# Patient Record
Sex: Female | Born: 1950 | Race: White | Hispanic: No | Marital: Married
Health system: Southern US, Community
[De-identification: ages and names within clinical notes are randomized; demographics above are authoritative.]

---

## 1999-08-13 ENCOUNTER — Encounter: Admission: RE | Admit: 1999-08-13 | Discharge: 1999-08-13 | Payer: Self-pay | Admitting: Family Medicine

## 1999-08-13 ENCOUNTER — Encounter: Payer: Self-pay | Admitting: Family Medicine

## 1999-12-04 ENCOUNTER — Encounter (INDEPENDENT_AMBULATORY_CARE_PROVIDER_SITE_OTHER): Payer: Self-pay | Admitting: Specialist

## 1999-12-05 ENCOUNTER — Observation Stay (HOSPITAL_COMMUNITY): Admission: EM | Admit: 1999-12-05 | Discharge: 1999-12-05 | Payer: Self-pay | Admitting: Emergency Medicine

## 1999-12-05 ENCOUNTER — Encounter: Payer: Self-pay | Admitting: Family Medicine

## 2000-09-18 ENCOUNTER — Other Ambulatory Visit: Admission: RE | Admit: 2000-09-18 | Discharge: 2000-09-18 | Payer: Self-pay | Admitting: Obstetrics and Gynecology

## 2000-09-18 ENCOUNTER — Encounter: Admission: RE | Admit: 2000-09-18 | Discharge: 2000-09-18 | Payer: Self-pay | Admitting: Family Medicine

## 2000-09-18 ENCOUNTER — Encounter: Payer: Self-pay | Admitting: Family Medicine

## 2001-11-21 ENCOUNTER — Encounter: Payer: Self-pay | Admitting: Family Medicine

## 2001-11-21 ENCOUNTER — Encounter: Admission: RE | Admit: 2001-11-21 | Discharge: 2001-11-21 | Payer: Self-pay | Admitting: Family Medicine

## 2002-12-23 ENCOUNTER — Encounter: Payer: Self-pay | Admitting: Family Medicine

## 2002-12-23 ENCOUNTER — Encounter: Admission: RE | Admit: 2002-12-23 | Discharge: 2002-12-23 | Payer: Self-pay | Admitting: Family Medicine

## 2004-01-22 ENCOUNTER — Other Ambulatory Visit: Admission: RE | Admit: 2004-01-22 | Discharge: 2004-01-22 | Payer: Self-pay | Admitting: Family Medicine

## 2004-02-02 ENCOUNTER — Encounter: Admission: RE | Admit: 2004-02-02 | Discharge: 2004-02-02 | Payer: Self-pay | Admitting: Family Medicine

## 2005-02-25 ENCOUNTER — Encounter: Admission: RE | Admit: 2005-02-25 | Discharge: 2005-02-25 | Payer: Self-pay | Admitting: Family Medicine

## 2006-02-27 ENCOUNTER — Encounter: Admission: RE | Admit: 2006-02-27 | Discharge: 2006-02-27 | Payer: Self-pay | Admitting: Family Medicine

## 2007-03-01 ENCOUNTER — Encounter: Admission: RE | Admit: 2007-03-01 | Discharge: 2007-03-01 | Payer: Self-pay | Admitting: Family Medicine

## 2008-03-11 ENCOUNTER — Encounter: Admission: RE | Admit: 2008-03-11 | Discharge: 2008-03-11 | Payer: Self-pay | Admitting: Unknown Physician Specialty

## 2008-03-17 ENCOUNTER — Encounter: Admission: RE | Admit: 2008-03-17 | Discharge: 2008-03-17 | Payer: Self-pay | Admitting: Unknown Physician Specialty

## 2008-03-17 ENCOUNTER — Encounter (INDEPENDENT_AMBULATORY_CARE_PROVIDER_SITE_OTHER): Payer: Self-pay | Admitting: Diagnostic Radiology

## 2008-03-20 ENCOUNTER — Encounter: Admission: RE | Admit: 2008-03-20 | Discharge: 2008-03-20 | Payer: Self-pay | Admitting: Unknown Physician Specialty

## 2008-04-11 ENCOUNTER — Ambulatory Visit (HOSPITAL_COMMUNITY): Admission: RE | Admit: 2008-04-11 | Discharge: 2008-04-12 | Payer: Self-pay | Admitting: Surgery

## 2008-04-11 ENCOUNTER — Encounter (INDEPENDENT_AMBULATORY_CARE_PROVIDER_SITE_OTHER): Payer: Self-pay | Admitting: Surgery

## 2008-05-07 ENCOUNTER — Ambulatory Visit (HOSPITAL_BASED_OUTPATIENT_CLINIC_OR_DEPARTMENT_OTHER): Admission: RE | Admit: 2008-05-07 | Discharge: 2008-05-07 | Payer: Self-pay | Admitting: Surgery

## 2008-10-01 ENCOUNTER — Ambulatory Visit (HOSPITAL_BASED_OUTPATIENT_CLINIC_OR_DEPARTMENT_OTHER): Admission: RE | Admit: 2008-10-01 | Discharge: 2008-10-01 | Payer: Self-pay | Admitting: Surgery

## 2009-06-04 IMAGING — CR DG CHEST 1V PORT
1 series · 1 of 1 positions shown · non-contrast
Comparison: .  No priors

CLINICAL DATA: Port-A-Cath placement for breast cancer

PORTABLE CHEST - 1 VIEW

[view not recorded]
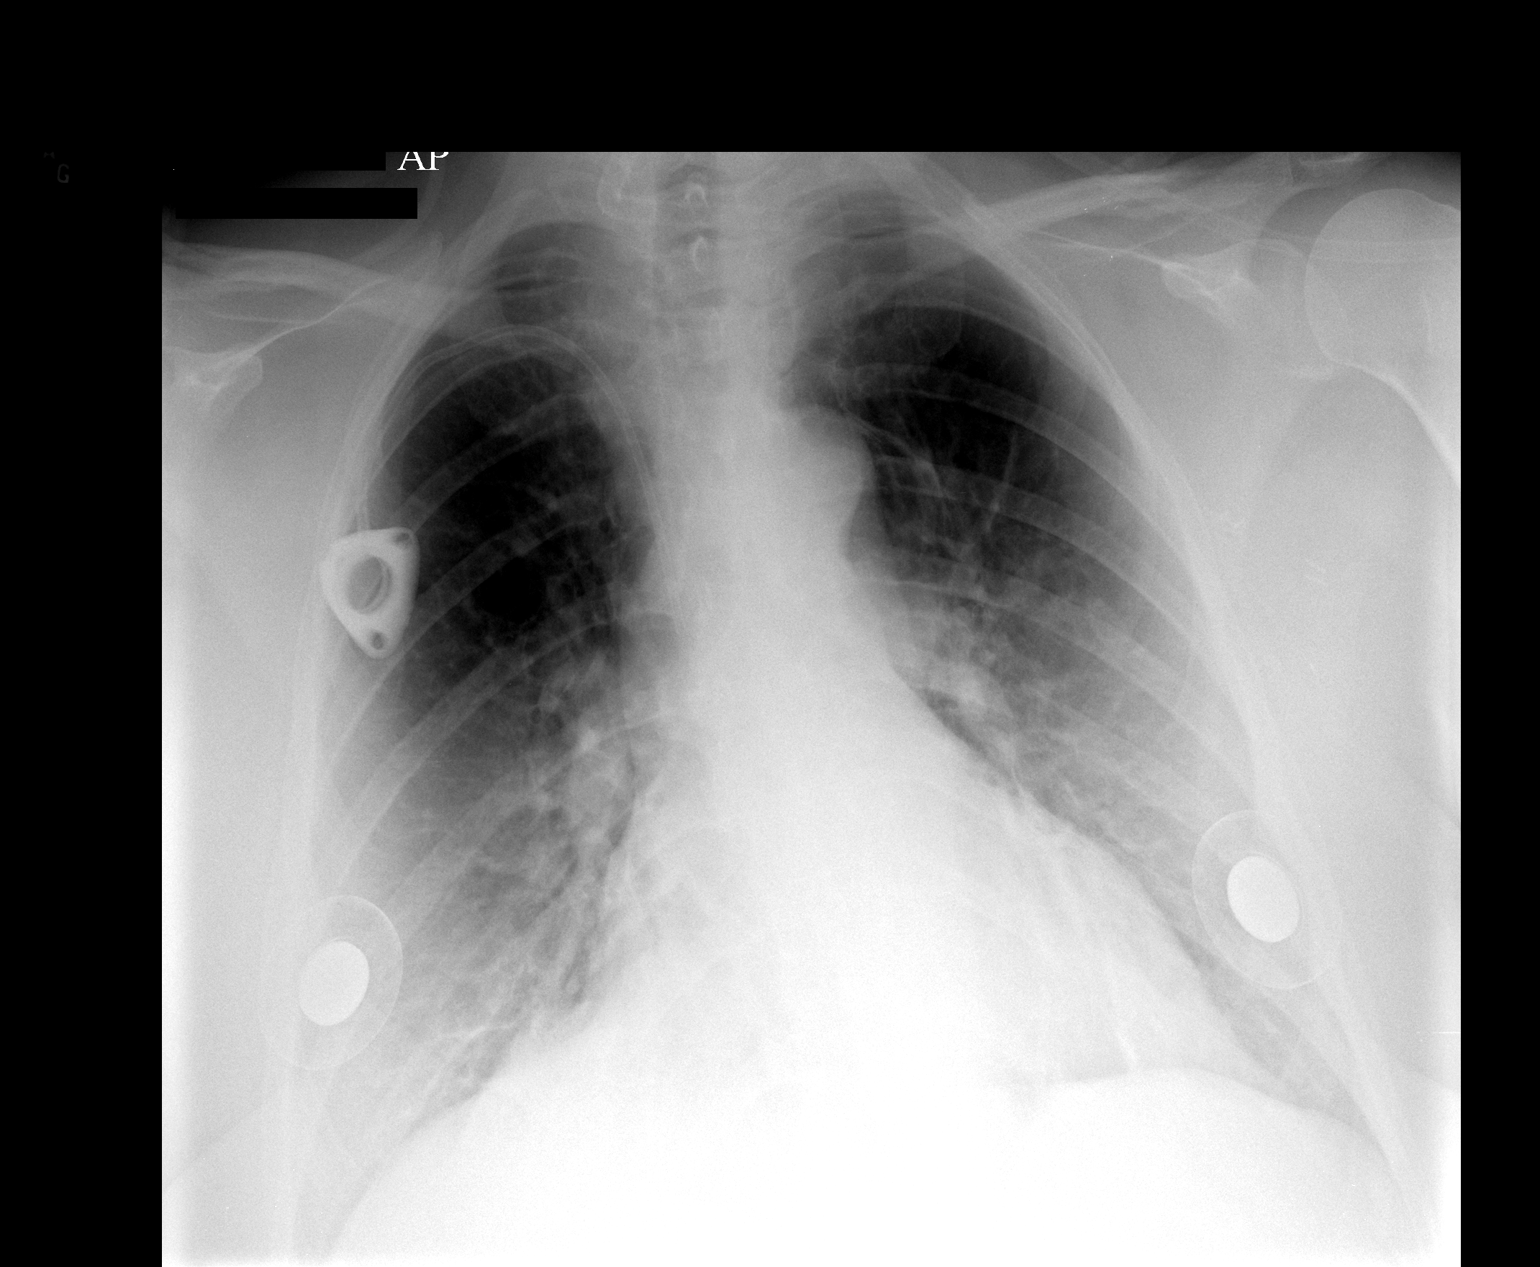

[1 of 1 positions shown; findings below may reference images not displayed]

FINDINGS: A right subclavian Port-A-Cath has been placed with its
tip in the mid SVC.  No pneumothorax or active disease.
IMPRESSION: 1.  Port-A-Cath placement as above.
2.  No pneumothorax or active disease.

## 2010-06-03 ENCOUNTER — Other Ambulatory Visit: Payer: Self-pay | Admitting: Unknown Physician Specialty

## 2010-06-04 ENCOUNTER — Ambulatory Visit
Admission: RE | Admit: 2010-06-04 | Discharge: 2010-06-04 | Disposition: A | Discharge status: Home / Self Care | Payer: BC Managed Care – PPO | Source: Ambulatory Visit | Attending: Unknown Physician Specialty | Admitting: Unknown Physician Specialty

## 2010-06-28 LAB — URINALYSIS, ROUTINE W REFLEX MICROSCOPIC
Bilirubin Urine: NEGATIVE
Hgb urine dipstick: NEGATIVE
Ketones, ur: NEGATIVE mg/dL
Nitrite: NEGATIVE
Specific Gravity, Urine: 1.023 (ref 1.005–1.030)
pH: 6 (ref 5.0–8.0)

## 2010-06-29 LAB — BASIC METABOLIC PANEL
BUN: 11 mg/dL (ref 6–23)
GFR calc non Af Amer: >60 mL/min (ref >60)
Glucose, Bld: 119 mg/dL — ABNORMAL HIGH (ref 70–99)

## 2010-06-29 LAB — POCT HEMOGLOBIN-HEMACUE: Hemoglobin: 10.8 g/dL — ABNORMAL LOW (ref 12.0–15.0)

## 2010-07-27 NOTE — Op Note (Signed)
NAMEDESHARA, [name]                 ACCOUNT NO.:  [id number]   MEDICAL RECORD NO.:  [id number]          PATIENT TYPE:  AMB   LOCATION:  DSC                          FACILITY:  [facility]   PHYSICIAN:  Currie Paris, M.D.DATE OF BIRTH:  1951-01-25   DATE OF PROCEDURE:  05/07/2008  DATE OF DISCHARGE:                               OPERATIVE REPORT   PREOPERATIVE DIAGNOSIS:  Carcinoma of left breast, upper outer quadrant,  stage II.   POSTOPERATIVE DIAGNOSIS:  Carcinoma of left breast, upper outer  quadrant, stage II.   OPERATION:  Placement of Port-A-Cath.   SURGEON:  Currie Paris, MD   ANESTHESIA:  General.   CLINICAL HISTORY:  This is a patient who needs Port-A-Cath access for IV  chemotherapy.  She has had bilateral mastectomies with an implant  reconstruction, implants placed (tissue expanders).  The wounds have  been healing nicely and she is now brought in for Port-A-Cath.   DESCRIPTION OF PROCEDURE:  I saw the patient in the holding area and  reviewed the plans for surgery.  She had no further questions.   The patient was taken to the operating room and after satisfactory  general (LMA) anesthesia had obtained, the upper chest and lower neck  were prepped and draped as a sterile field.  The time-out was done.   The right subclavian vein was entered on the initial attempt and the  guidewire threaded easily, but initially crossed the midline.  Using  fluoro, I was able to adjust that and advanced it into the superior vena  cava.   I stayed fairly high, so I made an incision just below that to place the  Port-A-Cath reservoir, so that it stays away from her implant  reconstruction.  I made a pocket and then brought the Port-A-Cath tubing  from the pocket into the guidewire site.   Using peel-away sheath and dilator, I dilated the tract using fluoro.  Initially, the guidewire backed up and I had to use fluoro to advance it  to make sure that was well into the  atrial area and then advanced the  dilator peel-away sheath up to the hub.  The guidewire and dilator were  removed and a catheter threaded easily to about 20 cm using fluoro that  appeared to be in the ventricle area.   I pulled out the peel-away sheath.  The catheter aspirated and flushed  easily.  Using some contrast in for good visualization, I backed it up  until it was when I thought in the distal superior vena cava just above  the atrium and well below the bifurcation of the trachea.   The reservoir was flushed, attached, and the locking mechanism engaged.  This aspirated and flushed easily.  I did a final fluoro to make sure  there no kinks.  Everything looked good position.  The reservoir was  then sutured in with some 2-0 Prolene.   Incision was then closed with 3-0 Vicryl followed by 4-0 Monocryl  subcuticular and Dermabond.  I aspirated and flushed with dilute  followed by concentrated heparin just prior  to closing.   The patient tolerated procedure well.  There were no operative  complications.  All counts were correct.      Currie Paris, M.D.  Electronically Signed     CJS/MEDQ  D:  05/07/2008  T:  05/08/2008  Job:  [id number]

## 2010-07-27 NOTE — Op Note (Signed)
[name], [name]                 ACCOUNT NO.:  [id number]   MEDICAL RECORD NO.:  [id number]          PATIENT TYPE:  AMB   LOCATION:  SDS                          FACILITY:  [facility]   PHYSICIAN:  Currie Paris, M.D.DATE OF BIRTH:  [birth year]   DATE OF PROCEDURE:  04/11/2008  DATE OF DISCHARGE:                               OPERATIVE REPORT   PREOPERATIVE DIAGNOSIS:  Carcinoma, left breast, upper outer quadrant,  clinical stage I.   POSTOPERATIVE DIAGNOSIS:  Left breast carcinoma, upper outer quadrant,  stage II.   OPERATION PERFORMED:  1. Right prophylactic mastectomy with implant removal.  2. Left total mastectomy with blue dye injection, sentinel lymph node      biopsy, and axillary dissection (modified radical mastectomy)   SURGEON:  Currie Paris, MD   ASSISTANT:  Letha Cape, PA   ANESTHESIA:  General.   CLINICAL HISTORY:  This is a 60 year old lady recently diagnosed with a  small left breast cancer upper outer quadrant, clinically stage I.  Because of some risk factors, she elected to have a prophylactic right  mastectomy at the same time as her left mastectomy.  She about 20years  ago had silicone implants in and she wished to have removed and replaced  with tissue expanders with Dr. Odis Luster as the plastic surgeon.   DESCRIPTION OF PROCEDURE:  The patient was seen in the holding area and  she had no further questions.  I confirmed the left side as the side for  the sentinel node, possible node dissection, and that we were taking  both breasts off.  She already had her radioisotope injected and I  initialed the left breast as the side of the sentinel node.   The patient was taken to the operating room and after satisfactory  general endotracheal anesthesia had been obtained, the time-out was  done.  I injected 5 mL of dilute methylene blue into the left subareolar  area and massaged that in.  Both breasts were then prepped and draped.  Appropriate  marking of the inframammary fold was done.   The right side was approached first.  I made a skin securing incision,  taking really only the skin of the nipple-areolar complex.  I raised  skin flaps medially towards the sternum, superiorly towards its  clavicle, laterally towards the axilla and latissimus, and inferiorly  towards the inframammary fold.  I removed the breast and the implant,  which was subglandular on top of the muscle.  The implant did rupture,  but we did not spill any silicone onto the wound.  The dissection was  all done with cautery.  I divided the final lateral attachments and  there was a little residual fatty tissue laterally that I took out.  I  made sure everything was dry and placed a moist pack.   Attention was turned to the left side.  Using the Neoprobe, I identified  a hot area in the axilla and made a skin marker over that.  I then made  an elliptical incision to try to include the old biopsy site in  the  upper outer quadrant.  I raised the skin flaps superiorly to the  clavicle and medially to the sternum and then laterally into the axilla.  I then used the Neoprobe to identify an area for dissection and found a  1.5-cm bright blue lymph node that was soft and removed this.  It had  counts about 1100.  When this was removed, there were no other lymph  nodes with counts nor any blue lymph nodes or any palpably abnormal  nodes.   I went back and finished the inferior flap going to inframammary fold  and then laterally to latissimus.  I then removed the breast from medial  to lateral again including the implant, which was a subglandular and on  top of the muscle, and there was some muscle stuck to the implant, but  it came out intact.  I opened the clavipectoral fascia.  At this point  in time, Dr. Debby Bud reported that the sentinel node was positive.  I  therefore opened up the clavipectoral fascia more and identified the  axillary vein, and I swept up the  axillary contents out medial to  lateral and superior to inferior identifying the long thoracic and  thoracodorsal nerves.  I tried to preserve the blood supply to the  pectoralis.  I divided the final lateral attachments to the anterior  edge of the latissimus and handed the specimen off.  A little additional  fatty tissue around the latissimus and around the pectoralis were taken  to make sure to leave the lymph nodes there.  I made sure everything was  dry.  A moist pack was placed.  At this point, Dr. Odis Luster entered to  proceed with reconstructions.   The patient tolerated the procedure well.  All counts were correct at  this point.  There were no operative complications. Dr Odis Luster was here  to place the tissue expanders and close.       Currie Paris, M.D.  Electronically Signed     CJS/MEDQ  D:  04/11/2008  T:  04/12/2008  Job:  [id number]

## 2010-07-27 NOTE — Op Note (Signed)
NAMEVERONIQUE, WARGA                 ACCOUNT NO.:  [id number]   MEDICAL RECORD NO.:  [id number]          PATIENT TYPE:  AMB   LOCATION:  DSC                          FACILITY:  [facility]   PHYSICIAN:  Currie Paris, M.D.DATE OF BIRTH:  Jan 20, 1951   DATE OF PROCEDURE:  10/01/2008  DATE OF DISCHARGE:                               OPERATIVE REPORT   PREOPERATIVE DIAGNOSIS:  Unneeded Port-a-Cath.   POSTOPERATIVE DIAGNOSIS:  Unneeded Port-a-Cath.   OPERATION:  Removal of Port-a-Cath.   SURGEON:  Currie Paris, MD   ANESTHESIA:  Local.   CLINICAL HISTORY:  Ms. Cuthrell is a 60 year old lady who has finished her  chemotherapy and wished to have her port out.  She has begun radiation  therapy for her breast cancer.   DESCRIPTION OF PROCEDURE:  The patient was seen in the minor procedure  room and we confirmed Port-a-Cath removal as the planned procedure and  confirmed its location in the right anterior chest wall just in the  infraclavicular area.   The area was then prepped with some alcohol and anesthetized with 1%  Xylocaine plain.  I then prepped it with Betadine.   The old scar was opened and the port identified.  A single holding  suture of Prolene was cut and the port backed out somewhat.  I put a 3-0  Vicryl to occlude the tract and then removed the Port-a-Cath tubing  which came out intact.  There was no backbleeding.   Incision was closed with 3-0 Vicryl, 4-0 Monocryl subcuticular and  Dermabond.   The patient tolerated the procedure well.      Currie Paris, M.D.  Electronically Signed     CJS/MEDQ  D:  10/01/2008  T:  10/02/2008  Job:  [id number]

## 2010-07-27 NOTE — Op Note (Signed)
[name], [name]                 ACCOUNT NO.:  [id number]   MEDICAL RECORD NO.:  [id number]          PATIENT TYPE:  OIB   LOCATION:  5152                         FACILITY:  [facility]   PHYSICIAN:  Etter Sjogren, M.D.     DATE OF BIRTH:  04-Dec-1950   DATE OF PROCEDURE:  04/11/2008  DATE OF DISCHARGE:                               OPERATIVE REPORT   PREOPERATIVE DIAGNOSIS:  Breast cancer.   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   PROCEDURE PERFORMED:  Bilateral breast reconstruction with tissue  expanders.   SURGEON:  Etter Sjogren, MD   ANESTHESIA:  General.   ESTIMATED BLOOD LOSS:  Minimal.   DRAINS:  Two Blake drains on right and three Blake drains on left.   CLINICAL NOTE:  A 57-year woman had breast cancer.  She is scheduled for  bilateral mastectomy and desired reconstruction.  Options are discussed  and she preferred to use a tissue expander followed by an implant.  These procedures were discussed with her in great detail.  The risks  plus complications are discussed including but not limited to bleeding,  infection, anesthesia complications, healing problems, scarring, fluid  collections, loss of sensation, loss of skin, failure of the  reconstruction, displacement of the implant, capsular contracture,  failure of the tissue expanders, and/or the implants and pulmonary  embolism, anesthesia-related complications that could include pneumonia  and she understood all of this as well as a planned staged nature of  these procedures and wished to proceed.   DESCRIPTION OF PROCEDURE:  The patient was taken to the operating room.  Bilateral mastectomy was then completed by Dr. Jamey Ripa.  The submuscular  space was developed bilaterally.  Meticulous hemostasis with  electrocautery.  Great care was taken to avoid damage to underlying  chest cavity, thorough irrigation with saline, thorough irrigation with  antibiotic solution.   Excellent hemostasis was achieved.  Two Blake  drains were positioned on the right side and three Blake drains on the  left side where she had an axillary dissection.  One of the drains on  each side was left in the deep space submuscular to drain the space for  the tissue expander.  A 100 mL of sterile saline was placed using the  closed filling system, actually 200 mL on the right side and 150 mL on  left side and the expanders were soaked with antibiotic solution for  greater than 5 minutes and the submuscular space was irrigated with  antibiotic solution.  The expanders were positioned and they were only  handled after thoroughly cleaning gloves.  Care was taken to make sure  that they were flat without wrinkles and the closure of the muscle with  3-0 Vicryl interrupted figure-of-eight sutures.  The skin closure with 3-  0 Monocryl interrupted inverted deep dermal  sutures and running 3-0 Monocryl subcuticular suture.  Steri-Strips, dry  sterile dressing, and circumferential Ace wrap were applied.  Prior to  the placing the dressings, the mastectomy flaps on each side had  excellent color and appeared to  be viable.  She tolerated it well and  transferred to the recovery room in stable condition.      Etter Sjogren, M.D.  Electronically Signed     DB/MEDQ  D:  04/11/2008  T:  04/12/2008  Job:  [id number]

## 2010-07-30 NOTE — Op Note (Signed)
[facility]  Patient:    [name], [name]                         MRN: [id number] Proc. Date: 12/05/99 Adm. Date:  [id number] Disc. Date: [id number] Attending:  Charlton Haws CC:         Elana Alm. Eliezer Lofts., M.D.  Desma Maxim, M.D.   Operative Report  ACCOUNT NUMBER:  [id number]  PREOPERATIVE DIAGNOSIS:  Acute appendicitis.  POSTOPERATIVE DIAGNOSIS:  Acute appendicitis.  OPERATION:  Laparoscopic appendectomy.  SURGEON:  Currie Paris, M.D.  ANESTHESIA:  General endotracheal.  CLINICAL HISTORY:  This patient is a 60 year old woman with a 24-hour history of abdominal pain which began in the right lower quadrant and remained in the right lower quadrant and was actually starting to spread out a little bit with more cross to the midline.  She was queasy but had no nausea or vomiting, fever or chills, and had a normal bowel movement this morning.  Initial white count when seen by Dr. Nicholos Johns was 11,000.  On recheck in the emergency room it was 14,000.  CT scan showed what appeared to be a distended appendix consistent with an early appendicitis.  No evidence of perforation or other significant inflammatory process.  DESCRIPTION OF PROCEDURE:  The patient was brought to the operating room after satisfactory general endotracheal anesthesia had been obtained.  The abdomen was prepped and draped.  I used 0.25% Marcaine at each incision site and made an umbilical incision, opened the fascia and entered the peritoneal cavity. Hasson was introduced and the abdomen insufflated to 15.  The patient had a 5 mm trocar placed in the right upper quadrant under direct vision and then was placed in Trendelenburg, and a left lower quadrant 11 mm trocar was placed under direct vision.  The base of the appendix was initially seen, and there was a little exudate on it, and the appendix tracked medially and inferiorly, and lay down close to the right ovary  and behind the uterus.  I was able to manipulate the small bowel out gently and then grab the appendix, working my way down to the tip, and then free it up and get it up into the anterior abdominal cavity easier.  I placed a grasper on that.  The mesoappendix was then divided by making a window right at the base of the appendix and putting the Endo GIA across there.  Unfortunately, there was a bleeding point in the staple line, and a couple of clips were placed on that to control it.  I had not gotten quite down to the base of the appendix, so there was a little bit more mesentery that had to be divided, and this was dissected out the right angle and clipped.  Once I had the base then cleared out, I put the Endo GIA back and divided the appendix and completed the appendectomy.  The appendix was placed in the bag and brought out the umbilical incision.  The Hasson port was reintroduced.  Because there had been some bleeding, I spent a lot of time irrigating and making sure everything was dry, washing out the few bits of clots that had occurred, and waited several minutes to be sure there was no active bleeding along the mesentery or by the appendiceal stump.  At this point, I concluded, and as I got ready to pull out the 5 mm trocar, I noticed that there had been  some bleeding around it.  When I backed it out, there was still a little bit of active bleeding, so I went ahead and used an Endoclose and put a 2-0 Vicryl suture through here and tied that down.  That got that completely dry.  Since this took a couple of minutes, a I took the opportunity to look one more time at the base of the appendix, and again it was dry.  The left lower quadrant trocar was removed, and there was no bleeding.  The abdomen was deflated through the umbilical port, and that was closed with a pursestring placed at the beginning of the case.  The skin incisions were closed with 4-0 Monocryl and subcuticular  closed with Steri-Strips.  The patient tolerated the procedure well.  There were no intraoperative complications.  All counts were correct. DD:  12/05/99 TD:  12/06/99 Job: [id number]

## 2010-08-23 ENCOUNTER — Ambulatory Visit
Admission: RE | Admit: 2010-08-23 | Discharge: 2010-08-23 | Disposition: A | Discharge status: Home / Self Care | Payer: BC Managed Care – PPO | Source: Ambulatory Visit | Attending: Unknown Physician Specialty | Admitting: Unknown Physician Specialty

## 2010-08-23 ENCOUNTER — Other Ambulatory Visit: Payer: Self-pay | Admitting: Unknown Physician Specialty

## 2010-08-23 DIAGNOSIS — R0602 Shortness of breath: Secondary | ICD-10-CM

## 2013-05-01 ENCOUNTER — Other Ambulatory Visit (HOSPITAL_COMMUNITY): Payer: Self-pay | Admitting: Family Medicine

## 2013-05-01 ENCOUNTER — Ambulatory Visit (HOSPITAL_COMMUNITY)
Admission: RE | Admit: 2013-05-01 | Discharge: 2013-05-01 | Disposition: A | Discharge status: Home / Self Care | Payer: 59 | Source: Ambulatory Visit | Attending: Family Medicine | Admitting: Family Medicine

## 2013-05-01 DIAGNOSIS — Z853 Personal history of malignant neoplasm of breast: Secondary | ICD-10-CM

## 2013-05-01 DIAGNOSIS — Z9010 Acquired absence of unspecified breast and nipple: Secondary | ICD-10-CM | POA: Insufficient documentation

## 2013-05-01 MED ORDER — GADOBENATE DIMEGLUMINE 529 MG/ML IV SOLN
20.0000 mL | Freq: Once | INTRAVENOUS | Status: AC | PRN
  Administered 2013-05-01: 20 mL via INTRAVENOUS

## 2013-05-02 LAB — POCT I-STAT, CHEM 8
BUN: 17 mg/dL (ref 6–23)
Calcium, Ion: 1.31 mmol/L — ABNORMAL HIGH (ref 1.13–1.30)
Chloride: 104 mEq/L (ref 96–112)
Creatinine, Ser: 0.9 mg/dL (ref 0.50–1.10)
GLUCOSE: 90 mg/dL (ref 70–99)
HCT: 44 % (ref 36.0–46.0)
HEMOGLOBIN: 15 g/dL (ref 12.0–15.0)
Potassium: 3.5 mEq/L — ABNORMAL LOW (ref 3.7–5.3)
Sodium: 143 mEq/L (ref 137–147)
TCO2: 27 mmol/L (ref 0–100)

## 2015-06-11 ENCOUNTER — Other Ambulatory Visit: Payer: Self-pay | Admitting: Gastroenterology
